# Patient Record
Sex: Female | Born: 1965 | Race: White | Hispanic: No
Health system: Southern US, Community
[De-identification: ages and names within clinical notes are randomized; demographics above are authoritative.]

## PROBLEM LIST (undated history)

## (undated) DIAGNOSIS — F419 Anxiety disorder, unspecified: Secondary | ICD-10-CM

## (undated) DIAGNOSIS — F32A Depression, unspecified: Secondary | ICD-10-CM

## (undated) DIAGNOSIS — I10 Essential (primary) hypertension: Secondary | ICD-10-CM

## (undated) HISTORY — PX: ABDOMINAL HYSTERECTOMY: SHX81

## (undated) HISTORY — PX: TONSILLECTOMY: SUR1361

## (undated) HISTORY — PX: KNEE SURGERY: SHX244

## (undated) HISTORY — PX: ADENOIDECTOMY: SUR15

---

## 2004-11-02 ENCOUNTER — Emergency Department: Payer: Self-pay | Admitting: Emergency Medicine

## 2005-01-10 ENCOUNTER — Ambulatory Visit: Payer: Self-pay | Admitting: Internal Medicine

## 2017-05-31 ENCOUNTER — Ambulatory Visit: Payer: Self-pay | Admitting: Family Medicine

## 2017-10-23 LAB — HM HIV SCREENING LAB: HM HIV SCREENING: NEGATIVE

## 2019-03-05 ENCOUNTER — Ambulatory Visit: Payer: BLUE CROSS/BLUE SHIELD | Attending: Internal Medicine

## 2019-03-05 DIAGNOSIS — Z20822 Contact with and (suspected) exposure to covid-19: Secondary | ICD-10-CM

## 2019-03-07 LAB — NOVEL CORONAVIRUS, NAA: SARS-CoV-2, NAA: NOT DETECTED

## 2019-09-08 ENCOUNTER — Ambulatory Visit: Payer: Self-pay | Admitting: Internal Medicine

## 2020-03-30 ENCOUNTER — Other Ambulatory Visit: Payer: Self-pay | Admitting: Family Medicine

## 2020-03-30 ENCOUNTER — Other Ambulatory Visit: Payer: Self-pay

## 2020-03-30 ENCOUNTER — Ambulatory Visit
Admission: RE | Admit: 2020-03-30 | Discharge: 2020-03-30 | Disposition: A | Discharge status: Home / Self Care | Payer: BC Managed Care – PPO | Source: Ambulatory Visit | Attending: Family Medicine | Admitting: Family Medicine

## 2020-03-30 DIAGNOSIS — K921 Melena: Secondary | ICD-10-CM | POA: Insufficient documentation

## 2020-03-30 DIAGNOSIS — R1030 Lower abdominal pain, unspecified: Secondary | ICD-10-CM

## 2020-03-30 LAB — POCT I-STAT CREATININE: Creatinine, Ser: 0.8 mg/dL (ref 0.44–1.00)

## 2020-03-30 MED ORDER — IOHEXOL 300 MG/ML  SOLN
100.0000 mL | Freq: Once | INTRAMUSCULAR | Status: AC | PRN
  Administered 2020-03-30: 100 mL via INTRAVENOUS

## 2020-09-23 ENCOUNTER — Emergency Department (HOSPITAL_BASED_OUTPATIENT_CLINIC_OR_DEPARTMENT_OTHER): Payer: BC Managed Care – PPO

## 2020-09-23 ENCOUNTER — Emergency Department (HOSPITAL_BASED_OUTPATIENT_CLINIC_OR_DEPARTMENT_OTHER): Payer: BC Managed Care – PPO | Attending: Emergency Medicine

## 2020-09-23 ENCOUNTER — Encounter (HOSPITAL_BASED_OUTPATIENT_CLINIC_OR_DEPARTMENT_OTHER): Payer: Self-pay

## 2020-09-23 ENCOUNTER — Other Ambulatory Visit: Payer: Self-pay

## 2020-09-23 ENCOUNTER — Emergency Department (HOSPITAL_BASED_OUTPATIENT_CLINIC_OR_DEPARTMENT_OTHER)
Admission: EM | Admit: 2020-09-23 | Discharge: 2020-09-23 | Disposition: A | Discharge status: Home / Self Care | Attending: Emergency Medicine | Admitting: Emergency Medicine

## 2020-09-23 DIAGNOSIS — Y990 Civilian activity done for income or pay: Secondary | ICD-10-CM | POA: Insufficient documentation

## 2020-09-23 DIAGNOSIS — W1830XA Fall on same level, unspecified, initial encounter: Secondary | ICD-10-CM | POA: Diagnosis not present

## 2020-09-23 DIAGNOSIS — S43005A Unspecified dislocation of left shoulder joint, initial encounter: Secondary | ICD-10-CM | POA: Diagnosis not present

## 2020-09-23 DIAGNOSIS — S4992XA Unspecified injury of left shoulder and upper arm, initial encounter: Secondary | ICD-10-CM | POA: Diagnosis present

## 2020-09-23 DIAGNOSIS — M25519 Pain in unspecified shoulder: Secondary | ICD-10-CM

## 2020-09-23 HISTORY — DX: Essential (primary) hypertension: I10

## 2020-09-23 HISTORY — DX: Depression, unspecified: F32.A

## 2020-09-23 HISTORY — DX: Anxiety disorder, unspecified: F41.9

## 2020-09-23 MED ORDER — ONDANSETRON HCL 4 MG/2ML IJ SOLN
4.0000 mg | Freq: Once | INTRAMUSCULAR | Status: AC
  Administered 2020-09-23: 4 mg via INTRAVENOUS
  Filled 2020-09-23: qty 2

## 2020-09-23 MED ORDER — PROPOFOL 10 MG/ML IV BOLUS
0.5000 mg/kg | Freq: Once | INTRAVENOUS | Status: AC
  Administered 2020-09-23: 38.8 mg via INTRAVENOUS

## 2020-09-23 MED ORDER — HYDROMORPHONE HCL 1 MG/ML IJ SOLN
1.0000 mg | Freq: Once | INTRAMUSCULAR | Status: AC
  Administered 2020-09-23: 1 mg via INTRAVENOUS
  Filled 2020-09-23: qty 1

## 2020-09-23 MED ORDER — SODIUM CHLORIDE 0.9 % IV BOLUS
500.0000 mL | Freq: Once | INTRAVENOUS | Status: AC
  Administered 2020-09-23: 500 mL via INTRAVENOUS

## 2020-09-23 MED ORDER — PROPOFOL 10 MG/ML IV BOLUS
1.0000 mg/kg | Freq: Once | INTRAVENOUS | Status: AC
  Administered 2020-09-23: 77.6 mg via INTRAVENOUS
  Filled 2020-09-23: qty 20

## 2020-09-23 MED ORDER — PROPOFOL 10 MG/ML IV BOLUS
0.5000 mg/kg | Freq: Once | INTRAVENOUS | Status: AC
  Administered 2020-09-23: 38.8 mg via INTRAVENOUS
  Filled 2020-09-23: qty 20

## 2020-09-23 MED ORDER — HYDROMORPHONE HCL 1 MG/ML IJ SOLN
2.0000 mg | Freq: Once | INTRAMUSCULAR | Status: AC
Start: 2020-09-23 — End: 2020-09-23
  Administered 2020-09-23: 2 mg via INTRAVENOUS
  Filled 2020-09-23: qty 2

## 2020-09-23 NOTE — ED Triage Notes (Signed)
Patient fell this morning on outstretched left arm, pain and obvious deformity to left shoulder.

## 2020-09-23 NOTE — ED Notes (Signed)
Patient was putting her bra on and her shoulder popped out of place again, MD notified and at [facility], attempted to reduce with manipulation, unsuccesful.

## 2020-09-23 NOTE — Sedation Documentation (Signed)
Dr. Delford Field, this RN, Lyda Jester, RN, Gerilyn Pilgrim, [place] and RT at bedisde

## 2020-09-23 NOTE — Sedation Documentation (Signed)
Shoulder immobilizer applied by Gerilyn Pilgrim, EMT. Dr. Delford Field at bedside. Reduction successful

## 2020-09-23 NOTE — Sedation Documentation (Signed)
Shoulder reduced by Dr. Delford Field. Laurie Russell, NT placing shoulder sling.

## 2020-09-23 NOTE — ED Notes (Signed)
Patient placed on ETCO2 monitor. Suction setup. Ambu bag at bedside

## 2020-09-23 NOTE — Sedation Documentation (Signed)
Nicky, RT. Placed pt on 4L [place]. Pt's O2 sat -90% on 2L. 95% on 4L

## 2020-09-23 NOTE — ED Provider Notes (Addendum)
[facility] EMERGENCY DEPARTMENT Provider Note   CSN: [id number] Arrival date & time: 09/23/20  0841     History No chief complaint on file.   KEYLY BALDONADO is a 55 y.o. female.  Tripped over a curb. Has a deformity to the left shoulder.  The history is provided by the patient.  Shoulder Injury This is a new problem. The current episode started 1 to 2 hours ago. The problem occurs constantly. The problem has not changed since onset.Pertinent negatives include no chest pain, no abdominal pain, no headaches and no shortness of breath. Exacerbated by: attempted use of the arm. Nothing relieves the symptoms. She has tried nothing for the symptoms. The treatment provided no relief.      No past medical history on file.  There are no problems to display for this patient.     OB History   No obstetric history on file.     No family history on file.     Home Medications Prior to Admission medications   Not on File    Allergies    Compazine [prochlorperazine edisylate]  Review of Systems   Review of Systems  Constitutional:  Negative for chills and fever.  HENT:  Negative for ear pain and sore throat.   Eyes:  Negative for pain and visual disturbance.  Respiratory:  Negative for cough and shortness of breath.   Cardiovascular:  Negative for chest pain and palpitations.  Gastrointestinal:  Negative for abdominal pain and vomiting.  Genitourinary:  Negative for dysuria and hematuria.  Musculoskeletal:  Negative for arthralgias and back pain.  Skin:  Negative for color change and rash.  Neurological:  Negative for seizures, syncope and headaches.  All other systems reviewed and are negative.  Physical Exam Updated Vital Signs BP (!) 138/93   Pulse 73   Temp 98 F (36.7 C) (Oral)   Resp (!) 21   Ht 5\' 4"  (1.626 m)   Wt 77.6 kg   SpO2 98%   BMI 29.37 kg/m   Physical Exam Vitals and nursing note reviewed.  HENT:     Head: Normocephalic and  atraumatic.     Mouth/Throat:     Mouth: Mucous membranes are moist.     Pharynx: Oropharynx is clear.  Eyes:     General: No scleral icterus.    Conjunctiva/sclera: Conjunctivae normal.  Neck:     Comments: Mild pain with full ROM. ROM not limited. Pulmonary:     Effort: Pulmonary effort is normal. No respiratory distress.  Musculoskeletal:     Comments: Anterior dislocation of the left shoulder. Limb is neurovascularly intact.  Skin:    General: Skin is warm and dry.  Neurological:     General: No focal deficit present.     Mental Status: She is alert and oriented to person, place, and time.  Psychiatric:        Mood and Affect: Mood normal.    ED Results / Procedures / Treatments   Labs (all labs ordered are listed, but only abnormal results are displayed) Labs Reviewed - No data to display  EKG None  Radiology DG Shoulder 1V Left  Result Date: 09/23/2020 CLINICAL DATA:  Postreduction of left shoulder EXAM: LEFT SHOULDER COMPARISON:  Shoulder radiographs obtained earlier the same day FINDINGS: Single view of the left shoulder demonstrates normal glenohumeral alignment. No fracture is identified on this single view. IMPRESSION: Normal glenohumeral alignment. No definite fracture identified on this single view. Electronically Signed  By: Lesia HausenPeter  Noone M.D.   On: 09/23/2020 13:07   DG Shoulder Left  Result Date: 09/23/2020 CLINICAL DATA:  Dislocated in status post fall Pain EXAM: LEFT SHOULDER - 2+ VIEW COMPARISON:  None. FINDINGS: Anterior dislocation of the left glenohumeral joint. Visualized soft tissues are unremarkable. IMPRESSION: Anterior left shoulder dislocation. Electronically Signed   By: Acquanetta BellingFarhaan  Mir M.D.   On: 09/23/2020 09:52   DG Shoulder Left Portable  Result Date: 09/23/2020 CLINICAL DATA:  Status post shoulder reduction. EXAM: LEFT SHOULDER COMPARISON:  None. FINDINGS: Interval reduction of left shoulder anterior dislocation. No fractures identified.  IMPRESSION: Interval reduction of left shoulder anterior dislocation. Electronically Signed   By: Signa Kellaylor  Stroud M.D.   On: 09/23/2020 10:47    Procedures .Ortho Injury Treatment  Date/Time: 09/23/2020 10:56 AM Performed by: Koleen DistanceWright, Karey Suthers G, MD Authorized by: Koleen DistanceWright, Guenevere Roorda G, MD   Consent:    Consent obtained:  Written   Consent given by:  Patient   Risks discussed:  Fracture, nerve damage, restricted joint movement, vascular damage, recurrent dislocation, irreducible dislocation and stiffness   Alternatives discussed:  No treatment, alternative treatment, immobilization, referral and delayed treatmentInjury location: shoulder Location details: left shoulder Injury type: dislocation Dislocation type: anterior Hill-Sachs deformity: no Chronicity: new Pre-procedure neurovascular assessment: neurovascularly intact Pre-procedure distal perfusion: normal Pre-procedure neurological function: normal Pre-procedure range of motion: reduced  Anesthesia: Local anesthesia used: no  Patient sedated: Yes. Refer to sedation procedure documentation for details of sedation. Manipulation performed: yes Reduction method: traction and counter traction Reduction successful: yes X-ray confirmed reduction: yes Immobilization: sling Post-procedure neurovascular assessment: post-procedure neurovascularly intact Post-procedure distal perfusion: normal Post-procedure neurological function: normal Post-procedure range of motion: normal   .Sedation  Date/Time: 09/23/2020 10:58 AM Performed by: Koleen DistanceWright, Illene Sweeting G, MD Authorized by: Koleen DistanceWright, Terry Abila G, MD   Consent:    Consent obtained:  Written   Consent given by:  Patient   Risks discussed:  Allergic reaction, dysrhythmia, inadequate sedation, nausea, prolonged hypoxia resulting in organ damage, prolonged sedation necessitating reversal, respiratory compromise necessitating ventilatory assistance and intubation and vomiting   Alternatives discussed:  Analgesia  without sedation and anxiolysis Universal protocol:    Immediately prior to procedure, a time out was called: yes     Patient identity confirmed:  Verbally with patient Indications:    Procedure performed:  Dislocation reduction   Procedure necessitating sedation performed by:  Physician performing sedation Pre-sedation assessment:    Time since last food or drink:  2 hours   ASA classification: class 1 - normal, healthy patient     Mouth opening:  3 or more finger widths   Thyromental distance:  4 finger widths   Mallampati score:  II - soft palate, uvula, fauces visible   Neck mobility: normal     Pre-sedation assessments completed and reviewed: airway patency, cardiovascular function, hydration status, mental status, nausea/vomiting, pain level, respiratory function and temperature     Pre-sedation assessment completed:  09/23/2020 10:00 AM Immediate pre-procedure details:    Reassessment: Patient reassessed immediately prior to procedure     Reviewed: vital signs, relevant labs/tests and NPO status     Verified: bag valve mask available, emergency equipment available, intubation equipment available, IV patency confirmed, oxygen available and suction available   Procedure details (see MAR for exact dosages):    Preoxygenation:  Nasal cannula   Sedation:  Propofol   Intended level of sedation: deep and moderate (conscious sedation)   Analgesia:  Hydromorphone   Intra-procedure monitoring:  Blood pressure monitoring, cardiac monitor, continuous capnometry, continuous pulse oximetry, frequent LOC assessments and frequent vital sign checks   Intra-procedure events: hypoxia     Intra-procedure events comment:  Had to increase O2 slightly   Intra-procedure management:  Supplemental oxygen   Total Provider sedation time (minutes):  20 Post-procedure details:    Post-sedation assessment completed:  09/23/2020 11:00 AM   Attendance: Constant attendance by certified staff until patient recovered      Recovery: Patient returned to pre-procedure baseline     Post-sedation assessments completed and reviewed: airway patency, cardiovascular function, hydration status, mental status, nausea/vomiting, pain level, respiratory function and temperature     Patient is stable for discharge or admission: yes     Procedure completion:  Tolerated well, no immediate complications .Ortho Injury Treatment  Date/Time: 09/23/2020 2:44 PM Performed by: Koleen Distance, MD Authorized by: Koleen Distance, MD   Consent:    Consent obtained:  Written   Consent given by:  Patient   Risks discussed:  Fracture, irreducible dislocation, nerve damage, recurrent dislocation, restricted joint movement, stiffness and vascular damage   Alternatives discussed:  No treatment, alternative treatment, immobilization, referral and delayed treatmentInjury location: shoulder Location details: left shoulder Injury type: dislocation Dislocation type: anterior Hill-Sachs deformity: no Chronicity: recurrent (Reduced earlier today) Pre-procedure neurovascular assessment: neurovascularly intact Pre-procedure distal perfusion: normal Pre-procedure neurological function: normal Pre-procedure range of motion: reduced  Anesthesia: Local anesthesia used: no  Patient sedated: Yes. Refer to sedation procedure documentation for details of sedation. Manipulation performed: yes Reduction method: external rotation Reduction successful: yes X-ray confirmed reduction: yes Immobilization: sling Post-procedure neurovascular assessment: post-procedure neurovascularly intact Post-procedure distal perfusion: normal Post-procedure neurological function: normal Post-procedure range of motion: improved   .Sedation  Date/Time: 09/23/2020 2:47 PM Performed by: Koleen Distance, MD Authorized by: Koleen Distance, MD   Consent:    Consent obtained:  Written   Consent given by:  Patient   Risks discussed:  Allergic reaction, dysrhythmia,  prolonged hypoxia resulting in organ damage, prolonged sedation necessitating reversal, inadequate sedation, respiratory compromise necessitating ventilatory assistance and intubation, nausea and vomiting Universal protocol:    Immediately prior to procedure, a time out was called: yes     Patient identity confirmed:  Verbally with patient Indications:    Procedure performed:  Dislocation reduction   Procedure necessitating sedation performed by:  Physician performing sedation Pre-sedation assessment:    Time since last food or drink:  4 hours   ASA classification: class 1 - normal, healthy patient     Mouth opening:  3 or more finger widths   Thyromental distance:  4 finger widths   Mallampati score:  II - soft palate, uvula, fauces visible   Pre-sedation assessments completed and reviewed: airway patency, cardiovascular function, hydration status, mental status, nausea/vomiting, pain level, respiratory function and temperature     Pre-sedation assessment completed:  09/23/2020 11:50 AM Immediate pre-procedure details:    Reassessment: Patient reassessed immediately prior to procedure     Reviewed: vital signs, relevant labs/tests and NPO status     Verified: bag valve mask available, emergency equipment available, intubation equipment available, IV patency confirmed, oxygen available and suction available   Procedure details (see MAR for exact dosages):    Preoxygenation:  Nasal cannula   Sedation:  Propofol   Intended level of sedation: moderate (conscious sedation)   Analgesia:  Hydromorphone   Intra-procedure monitoring:  Blood pressure monitoring, cardiac monitor, continuous capnometry, continuous pulse oximetry, frequent LOC assessments  and frequent vital sign checks   Intra-procedure events: none     Total Provider sedation time (minutes):  20 Post-procedure details:    Post-sedation assessment completed:  09/23/2020 12:20 PM   Attendance: Constant attendance by certified staff until  patient recovered     Recovery: Patient returned to pre-procedure baseline     Post-sedation assessments completed and reviewed: airway patency, cardiovascular function, hydration status, mental status, nausea/vomiting, pain level, respiratory function and temperature     Patient is stable for discharge or admission: yes     Procedure completion:  Tolerated well, no immediate complications   Medications Ordered in ED Medications  sodium chloride 0.9 % bolus 500 mL (0 mLs Intravenous Stopped 09/23/20 1100)  HYDROmorphone (DILAUDID) injection 1 mg (1 mg Intravenous Given 09/23/20 0908)  ondansetron (ZOFRAN) injection 4 mg (4 mg Intravenous Given 09/23/20 0908)  propofol (DIPRIVAN) 10 mg/mL bolus/IV push 38.8 mg (38.8 mg Intravenous Given 09/23/20 0952)  propofol (DIPRIVAN) 10 mg/mL bolus/IV push 38.8 mg (38.8 mg Intravenous Given 09/23/20 0954)  propofol (DIPRIVAN) 10 mg/mL bolus/IV push 38.8 mg (38.8 mg Intravenous Given 09/23/20 0958)  propofol (DIPRIVAN) 10 mg/mL bolus/IV push 38.8 mg (38.8 mg Intravenous Given 09/23/20 1000)  propofol (DIPRIVAN) 10 mg/mL bolus/IV push 77.6 mg (77.6 mg Intravenous Given 09/23/20 1200)  HYDROmorphone (DILAUDID) injection 2 mg (2 mg Intravenous Given 09/23/20 1125)    ED Course  I have reviewed the triage vital signs and the nursing notes.  Pertinent labs & imaging results that were available during my care of the patient were reviewed by me and considered in my medical decision making (see chart for details).  Clinical Course as of 09/23/20 1446  Thu Sep 23, 2020  1121 Ms. Captain was getting dressed, and she was attempting to fasten her bra.  Her shoulder became dislocated again.  I attempted scapular manipulation and a modified Stimson technique.  However, she was quite agitated, and muscular tension was contributing to her persistent dislocation.  Therefore, we will carry out another procedural sedation. [AW]    Clinical Course User Index [AW] Koleen Distance, MD    MDM Rules/Calculators/A&P                           Gaynelle Arabian presented with a shoulder dislocation.  It was reduced with moderate sedation.  No bony deformity.  She was placed in a sling.  She will be referred to orthopedic or her companies occupational medicine provider.  She was placed in a shoulder immobilizer after her second reduction. Final Clinical Impression(s) / ED Diagnoses Final diagnoses:  Dislocation of left shoulder joint, initial encounter    Rx / DC Orders ED Discharge Orders     None        Koleen Distance, MD 09/23/20 1104    Koleen Distance, MD 09/23/20 1450

## 2020-09-23 NOTE — Sedation Documentation (Signed)
Pt A&O at baseline

## 2023-05-14 IMAGING — DX DG SHOULDER 1V*L*
1 series · 1 of 1 positions shown · non-contrast
Comparison: None.

CLINICAL DATA: Status post shoulder reduction.

EXAM:
LEFT SHOULDER

[shoulder ap]
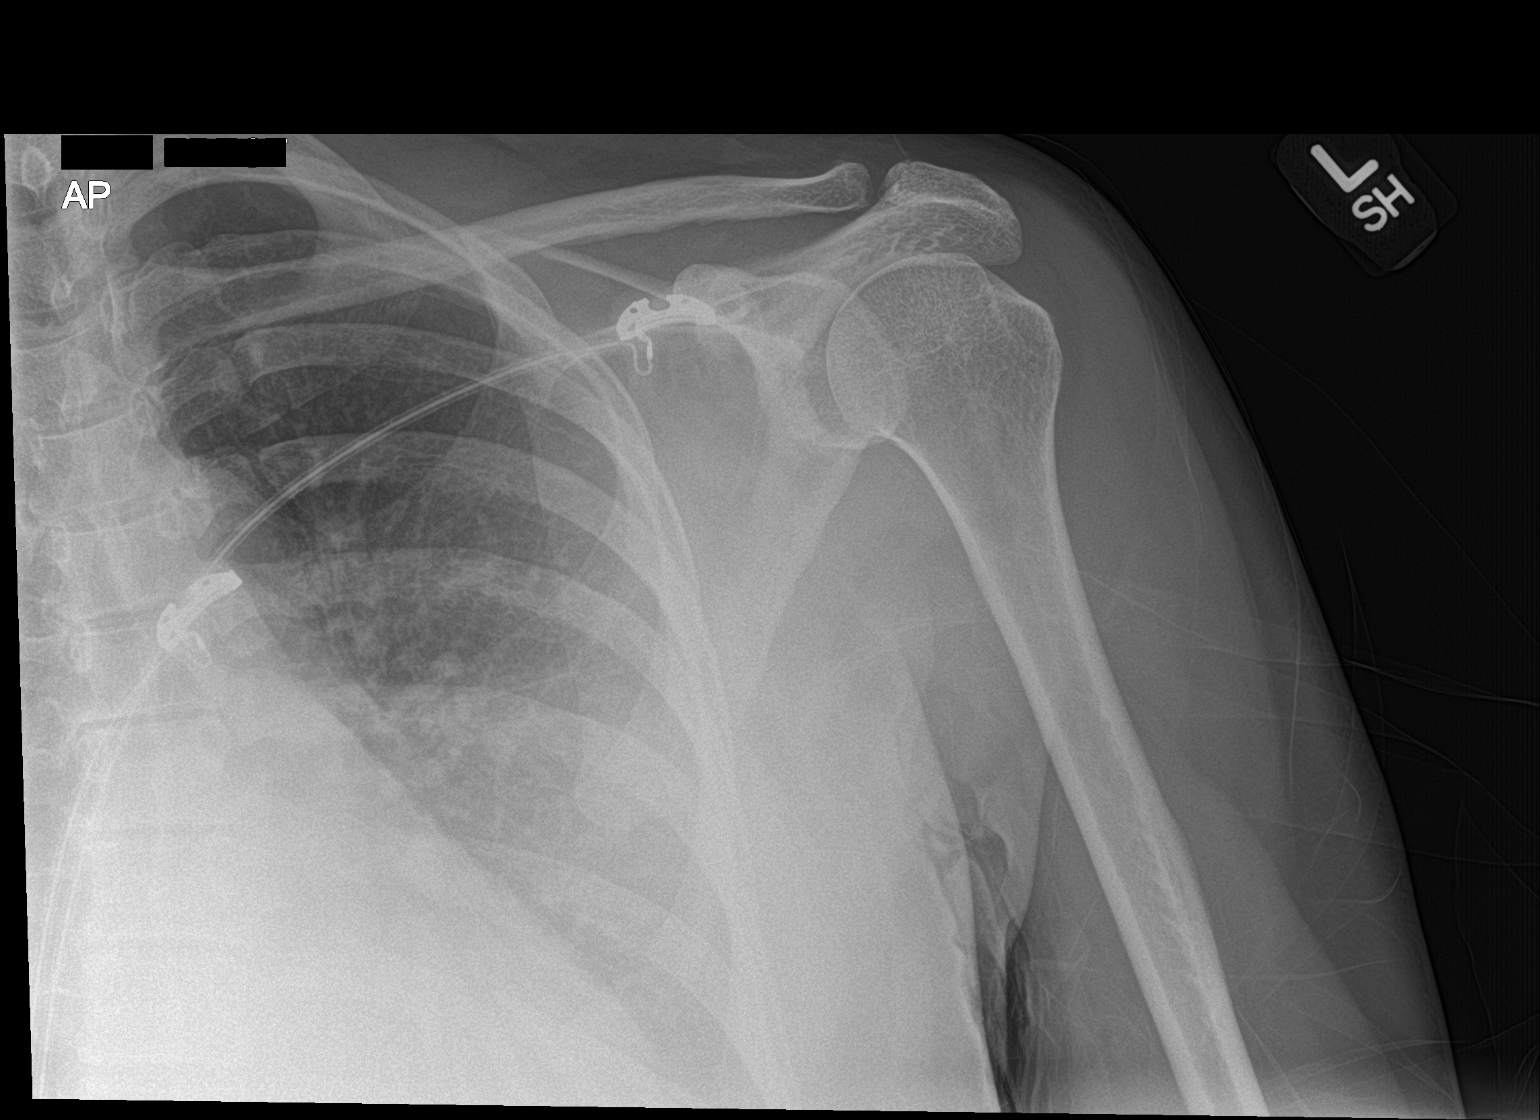

[1 of 1 positions shown; findings below may reference images not displayed]

FINDINGS: Interval reduction of left shoulder anterior dislocation. No
fractures identified.
IMPRESSION: Interval reduction of left shoulder anterior dislocation.
